# Patient Record
Sex: Female | Born: 2000 | Race: White | Hispanic: No | Marital: Single
Health system: Southern US, Community
[De-identification: ages and names within clinical notes are randomized; demographics above are authoritative.]

## PROBLEM LIST (undated history)

## (undated) DIAGNOSIS — S42009A Fracture of unspecified part of unspecified clavicle, initial encounter for closed fracture: Secondary | ICD-10-CM

## (undated) HISTORY — PX: APPENDECTOMY: SHX54

---
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
## 2019-04-02 ENCOUNTER — Emergency Department (HOSPITAL_COMMUNITY): Payer: No Typology Code available for payment source

## 2019-04-02 ENCOUNTER — Emergency Department (HOSPITAL_COMMUNITY)
Admission: EM | Admit: 2019-04-02 | Discharge: 2019-04-03 | Disposition: A | Discharge status: Home / Self Care | Payer: No Typology Code available for payment source | Attending: Emergency Medicine | Admitting: Emergency Medicine

## 2019-04-02 DIAGNOSIS — M545 Low back pain, unspecified: Secondary | ICD-10-CM

## 2019-04-02 DIAGNOSIS — Y999 Unspecified external cause status: Secondary | ICD-10-CM | POA: Insufficient documentation

## 2019-04-02 DIAGNOSIS — S60512A Abrasion of left hand, initial encounter: Secondary | ICD-10-CM | POA: Diagnosis not present

## 2019-04-02 DIAGNOSIS — S7002XA Contusion of left hip, initial encounter: Secondary | ICD-10-CM | POA: Diagnosis not present

## 2019-04-02 DIAGNOSIS — S161XXA Strain of muscle, fascia and tendon at neck level, initial encounter: Secondary | ICD-10-CM | POA: Diagnosis not present

## 2019-04-02 DIAGNOSIS — S50812A Abrasion of left forearm, initial encounter: Secondary | ICD-10-CM | POA: Diagnosis not present

## 2019-04-02 DIAGNOSIS — T07XXXA Unspecified multiple injuries, initial encounter: Secondary | ICD-10-CM

## 2019-04-02 DIAGNOSIS — R10814 Left lower quadrant abdominal tenderness: Secondary | ICD-10-CM | POA: Diagnosis not present

## 2019-04-02 DIAGNOSIS — Y929 Unspecified place or not applicable: Secondary | ICD-10-CM | POA: Insufficient documentation

## 2019-04-02 DIAGNOSIS — Z23 Encounter for immunization: Secondary | ICD-10-CM | POA: Insufficient documentation

## 2019-04-02 DIAGNOSIS — Y939 Activity, unspecified: Secondary | ICD-10-CM | POA: Insufficient documentation

## 2019-04-02 DIAGNOSIS — R51 Headache: Secondary | ICD-10-CM | POA: Insufficient documentation

## 2019-04-02 MED ORDER — SODIUM CHLORIDE 0.9 % IV BOLUS (SEPSIS)
1000.0000 mL | Freq: Once | INTRAVENOUS | Status: AC
  Administered 2019-04-03: 1000 mL via INTRAVENOUS

## 2019-04-02 MED ORDER — FENTANYL CITRATE (PF) 100 MCG/2ML IJ SOLN
50.0000 ug | Freq: Once | INTRAMUSCULAR | Status: AC
  Administered 2019-04-03: 50 ug via INTRAVENOUS
  Filled 2019-04-02: qty 2

## 2019-04-02 MED ORDER — TETANUS-DIPHTH-ACELL PERTUSSIS 5-2.5-18.5 LF-MCG/0.5 IM SUSP
0.5000 mL | Freq: Once | INTRAMUSCULAR | Status: AC
  Administered 2019-04-03: 0.5 mL via INTRAMUSCULAR
  Filled 2019-04-02: qty 0.5

## 2019-04-03 ENCOUNTER — Emergency Department (HOSPITAL_COMMUNITY): Payer: No Typology Code available for payment source

## 2019-04-03 ENCOUNTER — Encounter (HOSPITAL_COMMUNITY): Payer: Self-pay | Admitting: Emergency Medicine

## 2019-04-03 ENCOUNTER — Other Ambulatory Visit: Payer: Self-pay

## 2019-04-03 LAB — COMPREHENSIVE METABOLIC PANEL
ALT: 21 U/L (ref 0–44)
AST: 29 U/L (ref 15–41)
Albumin: 3.9 g/dL (ref 3.5–5.0)
Alkaline Phosphatase: 77 U/L (ref 38–126)
Anion gap: 8 (ref 5–15)
BUN: 14 mg/dL (ref 6–20)
CO2: 23 mmol/L (ref 22–32)
Calcium: 8.9 mg/dL (ref 8.9–10.3)
Chloride: 106 mmol/L (ref 98–111)
Creatinine, Ser: 0.85 mg/dL (ref 0.44–1.00)
GFR calc Af Amer: >60 mL/min (ref >60)
GFR calc non Af Amer: >60 mL/min (ref >60)
Glucose, Bld: 137 mg/dL — ABNORMAL HIGH (ref 70–99)
Potassium: 4.2 mmol/L (ref 3.5–5.1)
Sodium: 137 mmol/L (ref 135–145)
Total Bilirubin: 0.6 mg/dL (ref 0.3–1.2)
Total Protein: 7 g/dL (ref 6.5–8.1)

## 2019-04-03 LAB — CBC
HCT: 41.9 % (ref 36.0–46.0)
Hemoglobin: 13.4 g/dL (ref 12.0–15.0)
MCH: 28.6 pg (ref 26.0–34.0)
MCHC: 32 g/dL (ref 30.0–36.0)
MCV: 89.5 fL (ref 80.0–100.0)
Platelets: 243 10*3/uL (ref 150–400)
RBC: 4.68 MIL/uL (ref 3.87–5.11)
RDW: 11.9 % (ref 11.5–15.5)
WBC: 11.5 10*3/uL — ABNORMAL HIGH (ref 4.0–10.5)
nRBC: 0 % (ref 0.0–0.2)

## 2019-04-03 LAB — CDS SEROLOGY

## 2019-04-03 LAB — SAMPLE TO BLOOD BANK

## 2019-04-03 LAB — PROTIME-INR
INR: 1.1 (ref 0.8–1.2)
Prothrombin Time: 13.6 seconds (ref 11.4–15.2)

## 2019-04-03 LAB — I-STAT BETA HCG BLOOD, ED (MC, WL, AP ONLY): I-stat hCG, quantitative: <5 m[IU]/mL (ref <5)

## 2019-04-03 LAB — LACTIC ACID, PLASMA: Lactic Acid, Venous: 1.8 mmol/L (ref 0.5–1.9)

## 2019-04-03 LAB — ETHANOL: Alcohol, Ethyl (B): <10 mg/dL (ref <10)

## 2019-04-03 MED ORDER — HYDROCODONE-ACETAMINOPHEN 5-325 MG PO TABS: 1.0000 | ORAL_TABLET | Freq: Four times a day (QID) | ORAL | 0 refills | Status: DC | PRN

## 2019-04-03 MED ORDER — FENTANYL CITRATE (PF) 100 MCG/2ML IJ SOLN
50.0000 ug | Freq: Once | INTRAMUSCULAR | Status: AC
  Administered 2019-04-03: 50 ug via INTRAVENOUS
  Filled 2019-04-03: qty 2

## 2019-04-03 MED ORDER — SODIUM CHLORIDE 0.9 % IV BOLUS (SEPSIS)
1000.0000 mL | Freq: Once | INTRAVENOUS | Status: AC
  Administered 2019-04-03: 1000 mL via INTRAVENOUS

## 2019-04-03 MED ORDER — IOHEXOL 300 MG/ML  SOLN
100.0000 mL | Freq: Once | INTRAMUSCULAR | Status: AC | PRN
  Administered 2019-04-03: 100 mL via INTRAVENOUS

## 2019-04-03 MED ORDER — KETOROLAC TROMETHAMINE 30 MG/ML IJ SOLN
30.0000 mg | Freq: Once | INTRAMUSCULAR | Status: AC
  Administered 2019-04-03: 30 mg via INTRAVENOUS
  Filled 2019-04-03: qty 1

## 2019-04-03 NOTE — ED Notes (Signed)
Patient transported to CT 
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
## 2019-04-03 NOTE — ED Notes (Signed)
Reviewed d/c instructions with pt, who verbalized understanding and had no outstanding questions. Pt departed in NAD.   

## 2019-04-03 NOTE — ED Notes (Signed)
Pt ambulated from Resusc room to the bathroom in the blue pod. Pt was slow but denies any pain going to the bathroom. When pt came out of the bathroom pt is complaining of some dizziness and nausea. Pt is also complaining of trouble hearing. Pt is assisted to a wheel chair and taken back to Resusc room. Pt is still complaining of nausea pt is given a vomit bag and a wet wash cloth is placed on the back of her neck. Pt is assisted back to bed and hooked backup to the monitor. RN Marquita Palms is informed of pt complaining of nausea, dizziness and having trouble hearing

## 2019-04-05 ENCOUNTER — Encounter (HOSPITAL_COMMUNITY): Payer: Self-pay | Admitting: Emergency Medicine

## 2019-08-05 ENCOUNTER — Encounter: Payer: Self-pay | Admitting: Physical Therapy

## 2019-08-05 ENCOUNTER — Ambulatory Visit: Payer: No Typology Code available for payment source | Attending: Student | Admitting: Physical Therapy

## 2019-08-05 ENCOUNTER — Other Ambulatory Visit: Payer: Self-pay

## 2019-08-05 DIAGNOSIS — M25561 Pain in right knee: Secondary | ICD-10-CM | POA: Insufficient documentation

## 2019-08-05 DIAGNOSIS — M545 Low back pain, unspecified: Secondary | ICD-10-CM

## 2019-08-05 DIAGNOSIS — M25562 Pain in left knee: Secondary | ICD-10-CM | POA: Diagnosis present
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
## 2019-08-18 ENCOUNTER — Other Ambulatory Visit: Payer: Self-pay

## 2019-08-18 ENCOUNTER — Encounter: Payer: Self-pay | Admitting: Physical Therapy

## 2019-08-18 ENCOUNTER — Ambulatory Visit: Payer: No Typology Code available for payment source | Attending: Student | Admitting: Physical Therapy

## 2019-08-18 DIAGNOSIS — M25562 Pain in left knee: Secondary | ICD-10-CM | POA: Diagnosis present

## 2019-08-18 DIAGNOSIS — M545 Low back pain, unspecified: Secondary | ICD-10-CM

## 2019-08-18 DIAGNOSIS — M25561 Pain in right knee: Secondary | ICD-10-CM | POA: Diagnosis present

## 2019-08-18 NOTE — Therapy (Signed)
[facility] [address] [place], [place], [address] Phone: [phone]   Fax:  [phone]  Physical Therapy Treatment  Patient Details  Name: Vickie Flesherriana M Alig MRN: [id number] Date of Birth: 05/31/2001 Referring Provider (PT): Ulyses SouthwardSarah Yacobi, PA-C   Encounter Date: 08/18/2019  PT End of Session - 08/18/19 1510    Visit Number  2    Number of Visits  24    Date for PT Re-Evaluation  10/28/19    Authorization Type  Med Pay    PT Start Time  1505    PT Stop Time  1544    PT Time Calculation (min)  39 min    Activity Tolerance  Patient tolerated treatment well    Behavior During Therapy  [facility] for tasks assessed/performed       Past Medical History:  Diagnosis Date  . Collar bone fracture     Past Surgical History:  Procedure Laterality Date  . APPENDECTOMY    . LAPAROSCOPIC APPENDECTOMY N/A 11/18/2014   Procedure: APPENDECTOMY LAPAROSCOPIC;  Surgeon: Judie PetitM. Leonia CoronaShuaib Farooqui, MD;  Location: [facility] OR;  Service: Pediatrics;  Laterality: N/A;    There were no vitals filed for this visit.  Subjective Assessment - 08/18/19 1507    Subjective  Pt. returns for first follow up since initial eval visit. She reports trying to walk more "straight" which has helped some with pain. Variable/limited HEP performance due to schedule.    Currently in Pain?  No/denies                       [facility] Treatment/Exercise - 08/18/19 0001      Knee/Hip Exercises: Stretches   Passive Hamstring Stretch  Right;Left;2 reps;30 seconds    Other Knee/Hip Stretches  sidelying manual left QL stretch 3x30 sec      Knee/Hip Exercises: Aerobic   Recumbent Bike  L1 x 5 min      Knee/Hip Exercises: Standing   Wall Squat Limitations  wall sit partial squat to 40 deg x 15 reps    Other Standing Knee Exercises  Monster walk with greeb band proximal to knees 20 feet x 3, standing hip 3-way with green band 1x10 ea. bilat.      Knee/Hip Exercises:  Supine   Bridges Limitations  hip bridge with legs on 55 cm ball x 15 reps    Other Supine Knee/Hip Exercises  clamshell with green band x 15 reps      Manual Therapy   Manual Therapy  Soft tissue mobilization    Soft tissue mobilization  STM left lumbar and posterolateral hip region in right sidelying             PT Education - 08/18/19 1548    Education Details  exercises, HEP, POC    Person(s) Educated  Patient    Methods  Explanation;Demonstration;Verbal cues    Comprehension  Verbalized understanding;Returned demonstration       PT Short Term Goals - 08/05/19 2137      PT SHORT TERM GOAL #1   Title  Independent with HEP    Baseline  needs HEP    Time  6    Period  Weeks    Status  New      PT SHORT TERM GOAL #2   Title  Return demos proper squat mechanics to decrease knee pain with squatting and lifting motions and improve ability for LE strength progression with closed chain activities  Baseline  quad dominant squat with decreased femoral control    Time  6    Period  Weeks    Status  New      PT SHORT TERM GOAL #3   Title  Tolerate standing/ambulation at least 30 minutes without limitation due to back and knee pain    Baseline  limited 10-20 minutes    Time  6    Period  Weeks    Status  New        PT Long Term Goals - 08/05/19 2139      PT LONG TERM GOAL #1   Title  Improve FOTO outcome measure score to 27% or less impairment    Baseline  49% limited    Time  12    Period  Weeks    Status  New    Target Date  10/28/19      PT LONG TERM GOAL #2   Title  Left hip and knee strength grossly 5/5 to improve ability for lifting activities, improved squat mechanics for decreased knee pain    Baseline  see objective    Time  12    Period  Weeks    Status  New    Target Date  10/28/19      PT LONG TERM GOAL #3   Title  Tolerate standing and ambulation periods 45-60 minutes for work duties, chores and community mobility without limitation due to  back and knee pain    Baseline  limited 10-20 minutes    Time  12    Period  Weeks    Status  New    Target Date  10/28/19            Plan - 08/18/19 1548    Clinical Impression Statement  At first follow up visit tx. was well-tolerated with performance/progression of strengthening exercises as well as manual therapy to left lumbar region. Limited closed chain activities/performed in partial ROM to avoid pain/due to patellofemoral mechanics noted at eval but will continue to add/progress as tolerated pending pain.    Personal Factors and Comorbidities  Time since onset of injury/illness/exacerbation;Other    Examination-Activity Limitations  Lift;Squat;Locomotion Level;Stand    Examination-Participation Restrictions  Community Activity;Meal Prep;Other    Stability/Clinical Decision Making  Stable/Uncomplicated    Clinical Decision Making  Low    Rehab Potential  Good    PT Frequency  --   1-2x/week   PT Duration  12 weeks    PT Treatment/Interventions  ADLs/Self Care Home Management;Cryotherapy;Ultrasound;Electrical Stimulation;Iontophoresis 4mg /ml Dexamethasone;Moist Heat;Gait training;Stair training;Balance training;Therapeutic exercise;Therapeutic activities;Functional mobility training;Neuromuscular re-education;Patient/family education;Manual techniques;Passive range of motion;Dry needling;Vasopneumatic Device;Spinal Manipulations;Taping    PT Next Visit Plan  continue knee and hip strengthening, lumbar/core stabilization, initial open chain focus until closed chain mechanics improve, stretch hamstrings, STM left lumbar/posterolateral hip region, modalities prn    PT Home Exercise Plan  quad sets, supine SLR, hip abd SLR, clamshell, hip bridge, hamstring stretch, left glut/posterolateral hip stretch    Consulted and Agree with Plan of Care  Patient       Patient will benefit from skilled therapeutic intervention in order to improve the following deficits and impairments:   Decreased strength, Pain, Impaired flexibility, Decreased activity tolerance, Decreased range of motion, Increased edema, Increased muscle spasms, Difficulty walking  Visit Diagnosis: Acute pain of left knee  Acute pain of right knee  Acute left-sided low back pain without sciatica     Problem List Patient Active Problem List  Diagnosis Date Noted  . Appendicitis, acute 11/18/2014  . Acute appendicitis 11/17/2014    Beaulah Dinning, PT, DPT 08/18/19 3:52 PM  [facility] [address] [place], [place], [address] Phone: [phone]   Fax:  [phone]  Name: Vickie Landry MRN: [id number] Date of Birth: 02/15/2001

## 2019-08-25 ENCOUNTER — Ambulatory Visit: Payer: No Typology Code available for payment source | Admitting: Physical Therapy

## 2019-08-25 ENCOUNTER — Other Ambulatory Visit: Payer: Self-pay

## 2019-08-25 ENCOUNTER — Encounter: Payer: Self-pay | Admitting: Physical Therapy

## 2019-08-25 DIAGNOSIS — M545 Low back pain, unspecified: Secondary | ICD-10-CM

## 2019-08-25 DIAGNOSIS — M25562 Pain in left knee: Secondary | ICD-10-CM

## 2019-08-25 DIAGNOSIS — M25561 Pain in right knee: Secondary | ICD-10-CM

## 2019-08-25 NOTE — Therapy (Signed)
[facility] Out[facility] Rehabilitation [facility] [address] [place], [place], [address] Phone: [phone]   Fax:  [phone]  Physical Therapy Treatment  Patient Details  Name: Vickie Landry MRN: [id number] Date of Birth: 28-Aug-2001 Referring Provider (PT): Ulyses Southward, PA-C   Encounter Date: 08/25/2019  PT End of Session - 08/25/19 1718    Visit Number  3    Number of Visits  24    Date for PT Re-Evaluation  10/28/19    Authorization Type  Med [employer]    PT Start Time  1632    PT Stop Time  1710    PT Time Calculation (min)  38 min    Activity Tolerance  Patient tolerated treatment well    Behavior During Therapy  [facility] for tasks assessed/performed       Past Medical History:  Diagnosis Date  . Collar bone fracture     Past Surgical History:  Procedure Laterality Date  . APPENDECTOMY    . LAPAROSCOPIC APPENDECTOMY N/A 11/18/2014   Procedure: APPENDECTOMY LAPAROSCOPIC;  Surgeon: Judie Petit. Leonia Corona, MD;  Location: [facility] OR;  Service: Pediatrics;  Laterality: N/A;    There were no vitals filed for this visit.  Subjective Assessment - 08/25/19 1633    Subjective  No pain at rest pre-tx. but noting intermittent pain in knees up to 5/10 with movement, walking at times. Back doing better this week. No significant soreness noted after last session. Limited HEP performance-discussed try to do exercises at least several days per week for improved progress with knees.    Currently in Pain?  No/denies                       [facility] Adult PT Treatment/Exercise - 08/25/19 0001      Exercises   Exercises  Lumbar      Lumbar Exercises: Stretches   Single Knee to Chest Stretch  Right;Left;5 reps   5-10 sec holds   Lower Trunk Rotation Limitations  x 10 reps ea. way bilat.    Passive Hamstring Stretch  Right;Left;2 reps;30 seconds    ITB Stretch  Right;Left;2 reps;30 seconds    ITB Stretch Limitations  performed on right with knee flexed due to  disocomfort reported when knee was extended      Lumbar Exercises: Supine   Bent Knee Raise  15 reps    Bridge with Ball Squeeze  20 reps    Straight Leg Raise  20 reps    Straight Leg Raises Limitations  bilat.      Knee/Hip Exercises: Stretches   Other Knee/Hip Stretches  sidelying manual left QL stretch 3x30 sec      Knee/Hip Exercises: Aerobic   Recumbent Bike  L2 x 5 minutes      Knee/Hip Exercises: Standing   Functional Squat Limitations  TRX partial squat x 10 reps (1 set only due to soreness)    Other Standing Knee Exercises  Monster walk with green band above knees 20 feet x 2, sidestepping with green band around feet 10 feet x 2      Knee/Hip Exercises: Seated   Other Seated Knee/Hip Exercises  seated hip ER with green band 2x10 ea. bilat.      Manual Therapy   Soft tissue mobilization  STM left lumbar and posterolateral hip region in right sidelying             PT Education - 08/25/19 1717    Education Details  exercises,  HEP    Person(s) Educated  Patient    Methods  Explanation;Demonstration    Comprehension  Verbalized understanding;Returned demonstration       PT Short Term Goals - 08/05/19 2137      PT SHORT TERM GOAL #1   Title  Independent with HEP    Baseline  needs HEP    Time  6    Period  Weeks    Status  New      PT SHORT TERM GOAL #2   Title  Return demos proper squat mechanics to decrease knee pain with squatting and lifting motions and improve ability for LE strength progression with closed chain activities    Baseline  quad dominant squat with decreased femoral control    Time  6    Period  Weeks    Status  New      PT SHORT TERM GOAL #3   Title  Tolerate standing/ambulation at least 30 minutes without limitation due to back and knee pain    Baseline  limited 10-20 minutes    Time  6    Period  Weeks    Status  New        PT Long Term Goals - 08/05/19 2139      PT LONG TERM GOAL #1   Title  Improve FOTO outcome measure  score to 27% or less impairment    Baseline  49% limited    Time  12    Period  Weeks    Status  New    Target Date  10/28/19      PT LONG TERM GOAL #2   Title  Left hip and knee strength grossly 5/5 to improve ability for lifting activities, improved squat mechanics for decreased knee pain    Baseline  see objective    Time  12    Period  Weeks    Status  New    Target Date  10/28/19      PT LONG TERM GOAL #3   Title  Tolerate standing and ambulation periods 45-60 minutes for work duties, chores and community mobility without limitation due to back and knee pain    Baseline  limited 10-20 minutes    Time  12    Period  Weeks    Status  New    Target Date  10/28/19            Plan - 08/25/19 1719    Clinical Impression Statement  Good progress for back and fair status for knees still with limited tolerance closed chain knee exercises with patellofemoral pain and altered femoral mechanics with closed chain activities    Personal Factors and Comorbidities  Time since onset of injury/illness/exacerbation;Other    Examination-Activity Limitations  Lift;Squat;Locomotion Level;Stand    Examination-Participation Restrictions  Community Activity;Meal Prep;Other    Stability/Clinical Decision Making  Stable/Uncomplicated    Clinical Decision Making  Low    Rehab Potential  Good    PT Frequency  --   1-2x/week   PT Duration  12 weeks    PT Treatment/Interventions  ADLs/Self Care Home Management;Cryotherapy;Ultrasound;Electrical Stimulation;Iontophoresis 4mg /ml Dexamethasone;Moist Heat;Gait training;Stair training;Balance training;Therapeutic exercise;Therapeutic activities;Functional mobility training;Neuromuscular re-education;Patient/family education;Manual techniques;Passive range of motion;Dry needling;Vasopneumatic Device;Spinal Manipulations;Taping    PT Next Visit Plan  continue knee and hip strengthening, lumbar/core stabilization, initial open chain focus until closed chain  mechanics improve, stretch hamstrings, STM left lumbar/posterolateral hip region, modalities prn    PT Home Exercise Plan  quad sets,  supine SLR, hip abd SLR, clamshell, hip bridge, hamstring stretch, left glut/posterolateral hip stretch    Consulted and Agree with Plan of Care  Patient       Patient will benefit from skilled therapeutic intervention in order to improve the following deficits and impairments:  Decreased strength, Pain, Impaired flexibility, Decreased activity tolerance, Decreased range of motion, Increased edema, Increased muscle spasms, Difficulty walking  Visit Diagnosis: Acute pain of left knee  Acute pain of right knee  Acute left-sided low back pain without sciatica     Problem List Patient Active Problem List   Diagnosis Date Noted  . Appendicitis, acute 11/18/2014  . Acute appendicitis 11/17/2014    Lazarus Gowdahristopher Zoch, PT, DPT 08/25/19 5:23 PM  [facility] [address] [place], [place], [address] Phone: [phone]   Fax:  [phone]  Name: Marylou Flesherriana M Ogarro MRN: [id number] Date of Birth: 09/18/2001

## 2019-09-01 ENCOUNTER — Ambulatory Visit: Payer: No Typology Code available for payment source | Admitting: Physical Therapy

## 2019-09-01 ENCOUNTER — Encounter: Payer: Self-pay | Admitting: Physical Therapy

## 2019-09-01 ENCOUNTER — Other Ambulatory Visit: Payer: Self-pay

## 2019-09-01 DIAGNOSIS — M25561 Pain in right knee: Secondary | ICD-10-CM

## 2019-09-01 DIAGNOSIS — M545 Low back pain, unspecified: Secondary | ICD-10-CM

## 2019-09-01 DIAGNOSIS — M25562 Pain in left knee: Secondary | ICD-10-CM

## 2019-09-01 NOTE — Therapy (Signed)
[place], [place], [address] Phone: [phone]   Fax:  [phone]  Physical Therapy Treatment  Patient Details  Name: Vickie Landry MRN: [id number] Date of Birth: 2001-05-30 Referring Provider (PT): Patrecia Pace, PA-C   Encounter Date: 09/01/2019  PT End of Session - 09/01/19 1626    Visit Number  4    Number of Visits  24    Date for PT Re-Evaluation  10/28/19    Authorization Type  Med Pay    PT Start Time  1344    PT Stop Time  1425    PT Time Calculation (min)  41 min    Activity Tolerance  Patient tolerated treatment well    Behavior During Therapy  [facility] for tasks assessed/performed       Past Medical History:  Diagnosis Date  . Landry bone fracture     Past Surgical History:  Procedure Laterality Date  . APPENDECTOMY    . LAPAROSCOPIC APPENDECTOMY N/A 11/18/2014   Procedure: APPENDECTOMY LAPAROSCOPIC;  Surgeon: Jerilynn Mages. Gerald Stabs, MD;  Location: [place];  Service: Pediatrics;  Laterality: N/A;    There were no vitals filed for this visit.  Subjective Assessment - 09/01/19 1543    Subjective  Left knee has been hurting recently. No right knee pain and back has been doing OK with some tightness noted on left side but minimal pain.    Currently in Pain?  Yes    Pain Score  4     Pain Score  4    Pain Location  Knee    Pain Orientation  Left    Pain Descriptors / Indicators  Sharp    Pain Onset  More than a month ago    Pain Frequency  Intermittent    Aggravating Factors   standing and walking    Pain Relieving Factors  rest    Effect of Pain on Daily Activities  limits mobility + positional tolerance for prolonged standing and ambulation         [facility] PT Assessment - 09/01/19 0001      Strength   Left Knee Flexion  5/5    Left Knee Extension  4+/5                   [facility] Adult PT Treatment/Exercise - 09/01/19 0001      Knee/Hip Exercises: Stretches   Passive  Hamstring Stretch  Right;Left;3 reps;30 seconds    ITB Stretch  Right;Left;3 reps;30 seconds      Knee/Hip Exercises: Aerobic   Recumbent Bike  L1-2 x 5 minutes      Knee/Hip Exercises: Standing   Other Standing Knee Exercises  hip abd isometric at wall 10 x 5 sec ea. bilat., hip hinge/SL RDL x 10 ea. bilat.    Other Standing Knee Exercises  sidestepping with Green Theraband at feet 15 feet x 2, partial squat at wall x 15 reps, hip hilke from 4" step x 15 reps ea. bilat.      Knee/Hip Exercises: Supine   Bridges with Clamshell  Strengthening;Both;2 sets;10 reps   green Theraband   Straight Leg Raises  AROM;Strengthening;Both;2 sets;10 reps    Straight Leg Raises Limitations  1 lb.    Other Supine Knee/Hip Exercises  clamshell x 20 ea. bilat. with alternating unilat. ROM      Manual Therapy   Manual therapy comments  patellar mobilization left knee-medial glides and tilt grade I-III,  Soft tissue mobilization  STM left lumbar paraspinals in right sidelying             PT Education - 09/01/19 1616    Education Details  exercises    Person(s) Educated  Patient    Methods  Explanation;Demonstration;Verbal cues    Comprehension  Returned demonstration;Verbalized understanding       PT Short Term Goals - 08/05/19 2137      PT SHORT TERM GOAL #1   Title  Independent with HEP    Baseline  needs HEP    Time  6    Period  Weeks    Status  New      PT SHORT TERM GOAL #2   Title  Return demos proper squat mechanics to decrease knee pain with squatting and lifting motions and improve ability for LE strength progression with closed chain activities    Baseline  quad dominant squat with decreased femoral control    Time  6    Period  Weeks    Status  New      PT SHORT TERM GOAL #3   Title  Tolerate standing/ambulation at least 30 minutes without limitation due to back and knee pain    Baseline  limited 10-20 minutes    Time  6    Period  Weeks    Status  New         PT Long Term Goals - 08/05/19 2139      PT LONG TERM GOAL #1   Title  Improve FOTO outcome measure score to 27% or less impairment    Baseline  49% limited    Time  12    Period  Weeks    Status  New    Target Date  10/28/19      PT LONG TERM GOAL #2   Title  Left hip and knee strength grossly 5/5 to improve ability for lifting activities, improved squat mechanics for decreased knee pain    Baseline  see objective    Time  12    Period  Weeks    Status  New    Target Date  10/28/19      PT LONG TERM GOAL #3   Title  Tolerate standing and ambulation periods 45-60 minutes for work duties, chores and community mobility without limitation due to back and knee pain    Baseline  limited 10-20 minutes    Time  12    Period  Weeks    Status  New    Target Date  10/28/19            Plan - 09/01/19 1626    Clinical Impression Statement  Still with some limitation of ability with closed chain knee exercises/compromised patellofemoral mechanics but improving from baseline status particularly with back-for knees expect more gradual progress to achieve strength gains.    Personal Factors and Comorbidities  Time since onset of injury/illness/exacerbation;Other    Examination-Activity Limitations  Lift;Squat;Locomotion Level;Stand    Examination-Participation Restrictions  Community Activity;Meal Prep;Other    Stability/Clinical Decision Making  Stable/Uncomplicated    Clinical Decision Making  Low    Rehab Potential  Good    PT Frequency  --   1-2x/week   PT Duration  12 weeks    PT Treatment/Interventions  ADLs/Self Care Home Management;Cryotherapy;Ultrasound;Electrical Stimulation;Iontophoresis 4mg /ml Dexamethasone;Moist Heat;Gait training;Stair training;Balance training;Therapeutic exercise;Therapeutic activities;Functional mobility training;Neuromuscular re-education;Patient/family education;Manual techniques;Passive range of motion;Dry needling;Vasopneumatic Device;Spinal  Manipulations;Taping    PT Next Visit  Plan  continue knee and hip strengthening, lumbar/core stabilization, initial open chain focus until closed chain mechanics improve, stretch hamstrings, STM left lumbar/posterolateral hip region, modalities prn    PT Home Exercise Plan  quad sets, supine SLR, hip abd SLR, clamshell, hip bridge, hamstring stretch, left glut/posterolateral hip stretch    Consulted and Agree with Plan of Care  Patient       Patient will benefit from skilled therapeutic intervention in order to improve the following deficits and impairments:  Decreased strength, Pain, Impaired flexibility, Decreased activity tolerance, Decreased range of motion, Increased edema, Increased muscle spasms, Difficulty walking  Visit Diagnosis: Acute pain of left knee  Acute pain of right knee  Acute left-sided low back pain without sciatica     Problem List Patient Active Problem List   Diagnosis Date Noted  . Appendicitis, acute 11/18/2014  . Acute appendicitis 11/17/2014    Lazarus Gowda, PT, DPT 09/01/19 4:29 PM  [facility] [address] [place], [place], [address] Phone: [phone]   Fax:  [phone]  Name: Vickie Landry MRN: [id number] Date of Birth: 01-03-2001

## 2019-09-06 ENCOUNTER — Other Ambulatory Visit: Payer: Self-pay

## 2019-09-06 ENCOUNTER — Ambulatory Visit: Payer: No Typology Code available for payment source | Admitting: Physical Therapy

## 2019-09-06 ENCOUNTER — Encounter: Payer: Self-pay | Admitting: Physical Therapy

## 2019-09-06 DIAGNOSIS — M545 Low back pain, unspecified: Secondary | ICD-10-CM

## 2019-09-06 DIAGNOSIS — M25562 Pain in left knee: Secondary | ICD-10-CM

## 2019-09-06 DIAGNOSIS — M25561 Pain in right knee: Secondary | ICD-10-CM

## 2019-09-06 NOTE — Therapy (Signed)
[place], [place], [address] Phone: [phone]   Fax:  [phone]  Physical Therapy Treatment  Patient Details  Name: Vickie Landry MRN: [id number] Date of Birth: 07/29/2001 Referring Provider (PT): Patrecia Pace, PA-C   Encounter Date: 09/06/2019  PT End of Session - 09/06/19 1633    Visit Number  5    Number of Visits  24    Date for PT Re-Evaluation  10/28/19    Authorization Type  Med Pay    PT Start Time  7342    PT Stop Time  1626    PT Time Calculation (min)  40 min    Activity Tolerance  Patient tolerated treatment well    Behavior During Therapy  [facility] for tasks assessed/performed       Past Medical History:  Diagnosis Date  . Collar bone fracture     Past Surgical History:  Procedure Laterality Date  . APPENDECTOMY    . LAPAROSCOPIC APPENDECTOMY N/A 11/18/2014   Procedure: APPENDECTOMY LAPAROSCOPIC;  Surgeon: Jerilynn Mages. Gerald Stabs, MD;  Location: [place];  Service: Pediatrics;  Laterality: N/A;    There were no vitals filed for this visit.  Subjective Assessment - 09/06/19 1549    Subjective  Pt. rates improvement for her back at 85-95% from baseline. For her knees she reports 75-80% improvement with some residual pain still in left knee.    How long can you walk comfortably?  1 mile, around 30 minutes    Diagnostic tests  CT scan, X-rays    Patient Stated Goals  "have knee working right"    Currently in Pain?  No/denies         [facility] PT Assessment - 09/06/19 0001      Observation/Other Assessments   Focus on Therapeutic Outcomes (FOTO)   37% limited      Strength   Left Hip Extension  5/5    Left Hip ABduction  5/5    Left Hip ADduction  5/5                   [facility] Adult PT Treatment/Exercise - 09/06/19 0001      Lumbar Exercises: Stretches   Passive Hamstring Stretch  Right;Left;2 reps;30 seconds    ITB Stretch  Right;Left;2 reps;30 seconds      Lumbar  Exercises: Supine   Dead Bug  15 reps      Knee/Hip Exercises: Aerobic   Recumbent Bike  L2 x 5 min      Knee/Hip Exercises: Standing   Other Standing Knee Exercises  hip abd isometric in standing 10 reps ea. bilat.      Knee/Hip Exercises: Supine   Bridges  AROM;Strengthening;Both;20 reps    Other Supine Knee/Hip Exercises  clamshell x 20 reps with blue band      Manual Therapy   Manual Therapy  Joint mobilization    Joint Mobilization  LAD bilat. hips grade I-IV    Soft tissue mobilization  STM left lumbar paraspinals in right sidelying             PT Education - 09/06/19 1632    Education Details  exercises, POC, HEP updates    Person(s) Educated  Patient    Methods  Explanation;Demonstration;Verbal cues;Handout    Comprehension  Returned demonstration;Verbalized understanding       PT Short Term Goals - 09/06/19 1633      PT SHORT TERM GOAL #1   Title  Independent  with HEP    Baseline  met    Time  6    Period  Weeks    Status  Achieved      PT SHORT TERM GOAL #2   Title  Return demos proper squat mechanics to decrease knee pain with squatting and lifting motions and improve ability for LE strength progression with closed chain activities    Baseline  still some decreased femoral control but tolerates wall sit partial squat    Time  6    Period  Weeks    Status  On-going      PT SHORT TERM GOAL #3   Title  Tolerate standing/ambulation at least 30 minutes without limitation due to back and knee pain    Baseline  improved but intermittently limited with left knee pain > LBP    Time  6    Period  Weeks    Status  On-going        PT Long Term Goals - 09/06/19 1635      PT LONG TERM GOAL #1   Title  Improve FOTO outcome measure score to 27% or less impairment    Baseline  37% limited    Time  12    Period  Weeks    Status  On-going      PT LONG TERM GOAL #2   Title  Left hip and knee strength grossly 5/5 to improve ability for lifting activities,  improved squat mechanics for decreased knee pain    Baseline  met except left knee ext 4+/5    Time  12    Period  Weeks    Status  Partially Met      PT LONG TERM GOAL #3   Title  Tolerate standing and ambulation periods 45-60 minutes for work duties, chores and community mobility without limitation due to back and knee pain    Baseline  variable-limited around 30 minutes/1 mile    Time  12    Period  Weeks    Status  On-going            Plan - 09/06/19 1633    Clinical Impression Statement  Pt. has attended 5 therapy visits with good progress from baseline for decreased back and knee symptoms. Still some residual pain in left knee as well as some tightness noted in lumbar region but LBP minimal. At this point and with discussing options with pt. plan will be for her to try continuing progress independently with updated HEP-if improvement continues then plan tentative d/c but plan of care to allow for return within original plan of care dates if further skilled PT needed.    Personal Factors and Comorbidities  Time since onset of injury/illness/exacerbation;Other    Examination-Activity Limitations  Lift;Squat;Locomotion Level;Stand    Stability/Clinical Decision Making  Stable/Uncomplicated    Rehab Potential  Good    PT Frequency  --   1-2x/week   PT Duration  12 weeks    PT Treatment/Interventions  ADLs/Self Care Home Management;Cryotherapy;Ultrasound;Electrical Stimulation;Iontophoresis 56m/ml Dexamethasone;Moist Heat;Gait training;Stair training;Balance training;Therapeutic exercise;Therapeutic activities;Functional mobility training;Neuromuscular re-education;Patient/family education;Manual techniques;Passive range of motion;Dry needling;Vasopneumatic Device;Spinal Manipulations;Taping    PT Next Visit Plan  continue knee and hip strengthening, lumbar/core stabilization, initial open chain focus until closed chain mechanics improve, stretch hamstrings, STM left  lumbar/posterolateral hip region, modalities prn    PT Home Exercise Plan  hip abd isometric, wall sit partial squat, hip bridge, monster walk, clamshell, hamstring stretches, supine SLR, dead bug  Consulted and Agree with Plan of Care  Patient       Patient will benefit from skilled therapeutic intervention in order to improve the following deficits and impairments:  Decreased strength, Pain, Impaired flexibility, Decreased activity tolerance, Decreased range of motion, Increased edema, Increased muscle spasms, Difficulty walking  Visit Diagnosis: Acute pain of left knee  Acute pain of right knee  Acute left-sided low back pain without sciatica     Problem List Patient Active Problem List   Diagnosis Date Noted  . Appendicitis, acute 11/18/2014  . Acute appendicitis 11/17/2014    Beaulah Dinning, PT, DPT 09/06/19 4:40 PM  [facility] [address] [place], [place], [address] Phone: [phone]   Fax:  [phone]  Name: Vickie Landry MRN: [id number] Date of Birth: 10/02/2001

## 2019-10-06 NOTE — Therapy (Signed)
[place], [place], [address] Phone: [phone]   Fax:  [phone]  Physical Therapy Treatment/Discharge  Patient Details  Name: Vickie Landry MRN: [id number] Date of Birth: [birth year] Referring Provider (PT): Patrecia Pace, PA-C   Encounter Date: 09/06/2019    Past Medical History:  Diagnosis Date  . Collar bone fracture     Past Surgical History:  Procedure Laterality Date  . APPENDECTOMY    . LAPAROSCOPIC APPENDECTOMY N/A 11/18/2014   Procedure: APPENDECTOMY LAPAROSCOPIC;  Surgeon: Jerilynn Mages. Gerald Stabs, MD;  Location: [facility];  Service: Pediatrics;  Laterality: N/A;    There were no vitals filed for this visit.                              PT Short Term Goals - 09/06/19 1633      PT SHORT TERM GOAL #1   Title  Independent with HEP    Baseline  met    Time  6    Period  Weeks    Status  Achieved      PT SHORT TERM GOAL #2   Title  Return demos proper squat mechanics to decrease knee pain with squatting and lifting motions and improve ability for LE strength progression with closed chain activities    Baseline  still some decreased femoral control but tolerates wall sit partial squat    Time  6    Period  Weeks    Status  On-going      PT SHORT TERM GOAL #3   Title  Tolerate standing/ambulation at least 30 minutes without limitation due to back and knee pain    Baseline  improved but intermittently limited with left knee pain > LBP    Time  6    Period  Weeks    Status  On-going        PT Long Term Goals - 09/06/19 1635      PT LONG TERM GOAL #1   Title  Improve FOTO outcome measure score to 27% or less impairment    Baseline  37% limited    Time  12    Period  Weeks    Status  On-going      PT LONG TERM GOAL #2   Title  Left hip and knee strength grossly 5/5 to improve ability for lifting activities, improved squat mechanics for decreased knee pain    Baseline  met except left knee ext 4+/5    Time  12    Period  Weeks    Status  Partially Met      PT LONG TERM GOAL #3   Title  Tolerate standing and ambulation periods 45-60 minutes for work duties, chores and community mobility without limitation due to back and knee pain    Baseline  variable-limited around 30 minutes/1 mile    Time  12    Period  Weeks    Status  On-going              Patient will benefit from skilled therapeutic intervention in order to improve the following deficits and impairments:  Decreased strength, Pain, Impaired flexibility, Decreased activity tolerance, Decreased range of motion, Increased edema, Increased muscle spasms, Difficulty walking  Visit Diagnosis: Acute pain of left knee  Acute pain of right knee  Acute left-sided low back pain without sciatica     Problem List Patient Active Problem List  Diagnosis Date Noted  . Appendicitis, acute 11/18/2014  . Acute appendicitis 11/17/2014      PHYSICAL THERAPY DISCHARGE SUMMARY  Visits from Start of Care: 5  Current functional level related to goals / functional outcomes: Patient last seen for therapy 09/06/19-plan of care was left open in case of need for return-patient had been improving and plan was to try HEP and return only if needed-patient did not return for further visits so plan d/c and recommend follow up with referring provide if having any changes in status.   Remaining deficits: NA   Education / Equipment: NA  Plan: Patient agrees to discharge.  Patient goals were partially met. Patient is being discharged due to meeting the stated rehab goals.  ?????          Beaulah Dinning, PT, DPT 10/06/19 4:47 PM     [place] [facility] [address] [place], [place], [address] Phone: [phone]   Fax:  [phone]  Name: Vickie Landry MRN: [id number] Date of Birth: [birth year]

## 2019-10-11 ENCOUNTER — Other Ambulatory Visit: Payer: Self-pay

## 2019-10-11 ENCOUNTER — Ambulatory Visit (HOSPITAL_COMMUNITY)
Admission: EM | Admit: 2019-10-11 | Discharge: 2019-10-11 | Disposition: A | Discharge status: Home / Self Care | Payer: Self-pay | Attending: Family Medicine | Admitting: Family Medicine

## 2019-10-11 ENCOUNTER — Encounter (HOSPITAL_COMMUNITY): Payer: Self-pay

## 2019-10-11 DIAGNOSIS — X501XXA Overexertion from prolonged static or awkward postures, initial encounter: Secondary | ICD-10-CM

## 2019-10-11 DIAGNOSIS — S83421A Sprain of lateral collateral ligament of right knee, initial encounter: Secondary | ICD-10-CM

## 2019-10-11 NOTE — Discharge Instructions (Signed)
This is most likely a knee sprain.  We will give you a knee brace to wear here in clinic. Rest, ice, elevate. Ibuprofen 600 mg every 6 hours Use the crutch as needed Follow up with orthopedics as needed.

## 2019-10-11 NOTE — ED Triage Notes (Signed)
Patient presents to Urgent Care with complaints of right knee pain since possibly injuring it while skating two days ago. Patient reports she "wants to see if the muscles are okay". Pt has been taking ibuprofen for pain, ambulatory with steady gait into triage.

## 2019-10-13 NOTE — ED Provider Notes (Signed)
[place]    CSN: [id number] Arrival date & time: 10/11/19  1325      History   Chief Complaint Chief Complaint  Patient presents with  . Knee Pain    HPI Vickie Landry is a 18 y.o. female.   Pt is an 18 year old female that presents with right knee pain x 2 days. Started after skating and doing the limbo, twisting knee outward. She is having generalized knee pain. This has been constant, waxing and waning. She has been taking ibuprofen for the pain with some relief. She is able to ambulate.  No bruising, swelling, deformity.   ROS per HPI    Knee Pain   Past Medical History:  Diagnosis Date  . Collar bone fracture     Patient Active Problem List   Diagnosis Date Noted  . Appendicitis, acute 11/18/2014  . Acute appendicitis 11/17/2014    Past Surgical History:  Procedure Laterality Date  . APPENDECTOMY    . LAPAROSCOPIC APPENDECTOMY N/A 11/18/2014   Procedure: APPENDECTOMY LAPAROSCOPIC;  Surgeon: Jerilynn Mages. Gerald Stabs, MD;  Location: [facility];  Service: Pediatrics;  Laterality: N/A;    OB History   No obstetric history on file.      Home Medications    Prior to Admission medications   Medication Sig Start Date End Date Taking? Authorizing Provider  acetaminophen (TYLENOL) 500 MG tablet Take 1,000 mg by mouth every 6 (six) hours as needed.    [provider]  fexofenadine (ALLEGRA) 60 MG tablet Take 1 tablet (60 mg total) by mouth daily. Patient not taking: Reported on 08/05/2019 07/28/17 10/11/19  Maczis, Barth Kirks, PA-C  fluticasone [facility]) 50 MCG/ACT nasal spray Place 2 sprays into both nostrils daily. Patient not taking: Reported on 08/05/2019 07/28/17 10/11/19  Maczis, Barth Kirks, PA-C    Family History Family History  Problem Relation Age of Onset  . Healthy Mother   . Healthy Father     Social History Social History   Tobacco Use  . Smoking status: Never Smoker  . Smokeless tobacco: Never Used  Substance Use Topics  .  Alcohol use: Yes  . Drug use: Not Currently     Allergies   Other   Review of Systems Review of Systems   Physical Exam Triage Vital Signs ED Triage Vitals  Enc Vitals Group     BP 10/11/19 1423 110/76     Pulse Rate 10/11/19 1423 80     Resp 10/11/19 1423 15     Temp 10/11/19 1423 98 F (36.7 C)     Temp Source 10/11/19 1423 Temporal     SpO2 10/11/19 1423 100 %     Weight --      Height --      Head Circumference --      Peak Flow --      Pain Score 10/11/19 1421 10     Pain Loc --      Pain Edu? --      Excl. in [place]? --    No data found.  Updated Vital Signs BP 110/76 (BP Location: Left Arm)   Pulse 80   Temp 98 F (36.7 C) (Temporal)   Resp 15   SpO2 100%   Visual Acuity Right Eye Distance:   Left Eye Distance:   Bilateral Distance:    Right Eye Near:   Left Eye Near:    Bilateral Near:     Physical Exam Vitals signs and nursing note  reviewed.  Constitutional:      General: She is not in acute distress.    Appearance: Normal appearance. She is not ill-appearing, toxic-appearing or diaphoretic.  HENT:     Head: Normocephalic.     Nose: Nose normal.     Mouth/Throat:     Pharynx: Oropharynx is clear.  Eyes:     Conjunctiva/sclera: Conjunctivae normal.  Neck:     Musculoskeletal: Normal range of motion.  Pulmonary:     Effort: Pulmonary effort is normal.  Musculoskeletal: Normal range of motion.        General: No swelling.     Comments: Mild generalized tenderness more to the LCL No laxity.   Skin:    General: Skin is warm and dry.     Findings: No rash.  Neurological:     Mental Status: She is alert.  Psychiatric:        Mood and Affect: Mood normal.      UC Treatments / Results  Labs (all labs ordered are listed, but only abnormal results are displayed) Labs Reviewed - No data to display  EKG   Radiology No results found.  Procedures Procedures (including critical care time)  Medications Ordered in UC Medications -  No data to display  Initial Impression / Assessment and Plan / UC Course  I have reviewed the triage vital signs and the nursing notes.  Pertinent labs & imaging results that were available during my care of the patient were reviewed by me and considered in my medical decision making (see chart for details).     LCL sprain- knee sleeve given here in clinic RICE Ibuprofen for pain and inflammation.  She has a crutch that she can use as needed.  Ortho as needed.  Final Clinical Impressions(s) / UC Diagnoses   Final diagnoses:  Sprain of lateral collateral ligament of right knee, initial encounter     Discharge Instructions     This is most likely a knee sprain.  We will give you a knee brace to wear here in clinic. Rest, ice, elevate. Ibuprofen 600 mg every 6 hours Use the crutch as needed Follow up with orthopedics as needed.     ED Prescriptions    None     PDMP not reviewed this encounter.   Janace Aris, NP 10/13/19 [phone]

## 2020-01-16 ENCOUNTER — Other Ambulatory Visit: Payer: Self-pay | Admitting: Family Medicine

## 2020-05-18 ENCOUNTER — Other Ambulatory Visit: Payer: Self-pay

## 2020-05-18 ENCOUNTER — Encounter (HOSPITAL_COMMUNITY): Payer: Self-pay

## 2020-05-18 DIAGNOSIS — N390 Urinary tract infection, site not specified: Secondary | ICD-10-CM | POA: Insufficient documentation

## 2020-05-18 DIAGNOSIS — Z20822 Contact with and (suspected) exposure to covid-19: Secondary | ICD-10-CM | POA: Insufficient documentation

## 2020-05-18 LAB — COMPREHENSIVE METABOLIC PANEL
ALT: 15 U/L (ref 0–44)
AST: 22 U/L (ref 15–41)
Albumin: 4.6 g/dL (ref 3.5–5.0)
Alkaline Phosphatase: 93 U/L (ref 38–126)
Anion gap: 10 (ref 5–15)
BUN: 10 mg/dL (ref 6–20)
CO2: 26 mmol/L (ref 22–32)
Calcium: 9.2 mg/dL (ref 8.9–10.3)
Chloride: 100 mmol/L (ref 98–111)
Creatinine, Ser: 0.81 mg/dL (ref 0.44–1.00)
GFR calc Af Amer: >60 mL/min (ref >60)
GFR calc non Af Amer: >60 mL/min (ref >60)
Glucose, Bld: 107 mg/dL — ABNORMAL HIGH (ref 70–99)
Potassium: 3.9 mmol/L (ref 3.5–5.1)
Sodium: 136 mmol/L (ref 135–145)
Total Bilirubin: 1.1 mg/dL (ref 0.3–1.2)
Total Protein: 8.4 g/dL — ABNORMAL HIGH (ref 6.5–8.1)

## 2020-05-18 LAB — CBC
HCT: 45.1 % (ref 36.0–46.0)
Hemoglobin: 15.1 g/dL — ABNORMAL HIGH (ref 12.0–15.0)
MCH: 30.3 pg (ref 26.0–34.0)
MCHC: 33.5 g/dL (ref 30.0–36.0)
MCV: 90.4 fL (ref 80.0–100.0)
Platelets: 267 10*3/uL (ref 150–400)
RBC: 4.99 MIL/uL (ref 3.87–5.11)
RDW: 11.6 % (ref 11.5–15.5)
WBC: 7.1 10*3/uL (ref 4.0–10.5)
nRBC: 0 % (ref 0.0–0.2)

## 2020-05-18 LAB — URINALYSIS, ROUTINE W REFLEX MICROSCOPIC
Bilirubin Urine: NEGATIVE
Glucose, UA: NEGATIVE mg/dL
Hgb urine dipstick: NEGATIVE
Ketones, ur: 20 mg/dL — AB
Nitrite: NEGATIVE
Protein, ur: 30 mg/dL — AB
Specific Gravity, Urine: 1.018 (ref 1.005–1.030)
Squamous Epithelial / LPF: >50 — ABNORMAL HIGH (ref 0–5)
pH: 6 (ref 5.0–8.0)

## 2020-05-18 LAB — I-STAT BETA HCG BLOOD, ED (MC, WL, AP ONLY): I-stat hCG, quantitative: <5 m[IU]/mL (ref <5)

## 2020-05-18 LAB — LIPASE, BLOOD: Lipase: 27 U/L (ref 11–51)

## 2020-05-18 MED ORDER — SODIUM CHLORIDE 0.9% FLUSH
3.0000 mL | Freq: Once | INTRAVENOUS | Status: AC
  Administered 2020-05-19: 3 mL via INTRAVENOUS

## 2020-05-18 NOTE — ED Triage Notes (Signed)
Arrived POV from home. Patient reports left lower abdominal pain and nausea without vomiting since Tuesday. Patient's mother reports that patient is not anatomically correct so it may be her gall bladder. Patient also reports she has been sleeping a lot more over the past week.

## 2020-05-19 ENCOUNTER — Emergency Department (HOSPITAL_COMMUNITY)
Admission: EM | Admit: 2020-05-19 | Discharge: 2020-05-19 | Disposition: A | Discharge status: Home / Self Care | Payer: Self-pay | Attending: Emergency Medicine | Admitting: Emergency Medicine

## 2020-05-19 DIAGNOSIS — N390 Urinary tract infection, site not specified: Secondary | ICD-10-CM

## 2020-05-19 DIAGNOSIS — R1032 Left lower quadrant pain: Secondary | ICD-10-CM

## 2020-05-19 LAB — POC SARS CORONAVIRUS 2 AG -  ED: SARS Coronavirus 2 Ag: NEGATIVE

## 2020-05-19 MED ORDER — ONDANSETRON 4 MG PO TBDP: ORAL_TABLET | ORAL | 0 refills | Status: DC

## 2020-05-19 MED ORDER — SODIUM CHLORIDE 0.9 % IV BOLUS
1000.0000 mL | Freq: Once | INTRAVENOUS | Status: AC
  Administered 2020-05-19: 1000 mL via INTRAVENOUS

## 2020-05-19 MED ORDER — ONDANSETRON HCL 4 MG/2ML IJ SOLN
4.0000 mg | Freq: Once | INTRAMUSCULAR | Status: AC | PRN
  Administered 2020-05-19: 4 mg via INTRAVENOUS
  Filled 2020-05-19: qty 2

## 2020-05-19 MED ORDER — CEPHALEXIN 500 MG PO CAPS: 500.0000 mg | ORAL_CAPSULE | Freq: Four times a day (QID) | ORAL | 0 refills | Status: DC

## 2020-05-19 MED ORDER — SODIUM CHLORIDE 0.9 % IV SOLN
1.0000 g | Freq: Once | INTRAVENOUS | Status: AC
  Administered 2020-05-19: 1 g via INTRAVENOUS
  Filled 2020-05-19: qty 10

## 2020-05-19 NOTE — Discharge Instructions (Signed)
1. Medications: Keflex, zofran, usual home medications °2. Treatment: rest, drink plenty of fluids, take medications as prescribed °3. Follow Up: Please followup with your primary doctor in 3 days for discussion of your diagnoses and further evaluation after today's visit; if you do not have a primary care doctor use the resource guide provided to find one; return to the ER for fevers, persistent vomiting, worsening abdominal pain or other concerning symptoms. ° °

## 2020-05-19 NOTE — ED Provider Notes (Signed)
[facility]-EMERGENCY DEPT Provider Note   CSN: [id number] Arrival date & time: 05/18/20  2117     History Chief Complaint  Patient presents with  . Nausea  . Abdominal Pain    left lower    Vickie Landry is a 19 y.o. female with a hx of appendectomy (2015) presents to the Emergency Department complaining of gradual, persistent, progressively worsening nausea and fatigue onset 3 days ago. Associated symptoms include intermittent LLQ abd pain and anorexia.  Pt reports severe nausea regardless of whether or not she eats.  No vomiting or diarrhea.  Last BM was this morning and normal.  LMP 3 weeks ago.  Pt denies vaginal or urinary symptoms.  Mother at bedside reports pt does not usually have symptoms with UTI.  No fever, chills, headache, neck pain, chest pain, SOB, weakness, dizziness, syncope, back pain.  No aggravating or alleviating factors.       The history is provided by the patient and medical records. No language interpreter was used.       Past Medical History:  Diagnosis Date  . Collar bone fracture     Patient Active Problem List   Diagnosis Date Noted  . Appendicitis, acute 11/18/2014  . Acute appendicitis 11/17/2014    Past Surgical History:  Procedure Laterality Date  . APPENDECTOMY    . LAPAROSCOPIC APPENDECTOMY N/A 11/18/2014   Procedure: APPENDECTOMY LAPAROSCOPIC;  Surgeon: Judie Petit. Leonia Corona, MD;  Location: [facility] OR;  Service: Pediatrics;  Laterality: N/A;     OB History   No obstetric history on file.     Family History  Problem Relation Age of Onset  . Healthy Mother   . Healthy Father     Social History   Tobacco Use  . Smoking status: Never Smoker  . Smokeless tobacco: Never Used  Substance Use Topics  . Alcohol use: Yes  . Drug use: Not Currently    Home Medications Prior to Admission medications   Medication Sig Start Date End Date Taking? Authorizing Provider  acetaminophen (TYLENOL) 500 MG tablet Take 1,000 mg  by mouth every 6 (six) hours as needed.    [provider]  cephALEXin (KEFLEX) 500 MG capsule Take 1 capsule (500 mg total) by mouth 4 (four) times daily. 05/19/20   Denika Krone, Dahlia Client, PA-C  ondansetron (ZOFRAN ODT) 4 MG disintegrating tablet 4mg  ODT q4 hours prn nausea/vomit 05/19/20   Harwood Nall, 07/19/20, PA-C  fexofenadine (ALLEGRA) 60 MG tablet Take 1 tablet (60 mg total) by mouth daily. Patient not taking: Reported on 08/05/2019 07/28/17 10/11/19  Maczis, 10/13/19, PA-C  fluticasone [facility]) 50 MCG/ACT nasal spray Place 2 sprays into both nostrils daily. Patient not taking: Reported on 08/05/2019 07/28/17 10/11/19  10/13/19, PA-C    Allergies    Other  Review of Systems   Review of Systems  Constitutional: Negative for appetite change, diaphoresis, fatigue, fever and unexpected weight change.  HENT: Negative for mouth sores.   Eyes: Negative for visual disturbance.  Respiratory: Negative for cough, chest tightness, shortness of breath and wheezing.   Cardiovascular: Negative for chest pain.  Gastrointestinal: Positive for abdominal pain and nausea. Negative for constipation, diarrhea and vomiting.  Endocrine: Negative for polydipsia, polyphagia and polyuria.  Genitourinary: Negative for dysuria, frequency, hematuria and urgency.  Musculoskeletal: Negative for back pain and neck stiffness.  Skin: Negative for rash.  Allergic/Immunologic: Negative for immunocompromised state.  Neurological: Negative for syncope, light-headedness and headaches.  Hematological: Does not bruise/bleed easily.  Psychiatric/Behavioral: Negative for sleep disturbance. The patient is not nervous/anxious.     Physical Exam Updated Vital Signs BP 120/89 (BP Location: Left Arm)   Pulse 100   Temp 98.7 F (37.1 C) (Oral)   Resp 16   LMP 05/01/2020   SpO2 98%   Physical Exam Vitals and nursing note reviewed.  Constitutional:      General: She is not in acute distress.    Appearance:  She is not diaphoretic.  HENT:     Head: Normocephalic.     Mouth/Throat:     Mouth: Mucous membranes are dry.  Eyes:     General: No scleral icterus.    Conjunctiva/sclera: Conjunctivae normal.  Cardiovascular:     Rate and Rhythm: Regular rhythm. Tachycardia present.     Pulses: Normal pulses.          Radial pulses are 2+ on the right side and 2+ on the left side.     Comments: Mild tachycardia  Pulmonary:     Effort: No tachypnea, accessory muscle usage, prolonged expiration, respiratory distress or retractions.     Breath sounds: Normal breath sounds. No stridor.     Comments: Equal chest rise. No increased work of breathing. Abdominal:     General: There is no distension.     Palpations: Abdomen is soft.     Tenderness: There is no abdominal tenderness. There is no right CVA tenderness, left CVA tenderness, guarding or rebound.     Hernia: No hernia is present.  Musculoskeletal:     Cervical back: Normal range of motion.     Comments: Moves all extremities equally and without difficulty.  Skin:    General: Skin is warm and dry.     Capillary Refill: Capillary refill takes less than 2 seconds.  Neurological:     Mental Status: She is alert.     GCS: GCS eye subscore is 4. GCS verbal subscore is 5. GCS motor subscore is 6.     Comments: Speech is clear and goal oriented.  Psychiatric:        Mood and Affect: Mood normal.     ED Results / Procedures / Treatments   Labs (all labs ordered are listed, but only abnormal results are displayed) Labs Reviewed  COMPREHENSIVE METABOLIC PANEL - Abnormal; Notable for the following components:      Result Value   Glucose, Bld 107 (*)    Total Protein 8.4 (*)    All other components within normal limits  CBC - Abnormal; Notable for the following components:   Hemoglobin 15.1 (*)    All other components within normal limits  URINALYSIS, ROUTINE W REFLEX MICROSCOPIC - Abnormal; Notable for the following components:   Color,  Urine AMBER (*)    APPearance TURBID (*)    Ketones, ur 20 (*)    Protein, ur 30 (*)    Leukocytes,Ua MODERATE (*)    Bacteria, UA RARE (*)    Squamous Epithelial / LPF >50 (*)    All other components within normal limits  URINE CULTURE  LIPASE, BLOOD  I-STAT BETA HCG BLOOD, ED ([facility], [facility], [facility] ONLY)  POC SARS CORONAVIRUS 2 AG -  ED    Procedures Procedures (including critical care time)  Medications Ordered in ED Medications  sodium chloride flush (NS) 0.9 % injection 3 mL (3 mLs Intravenous Given 05/19/20 0032)  ondansetron (ZOFRAN) injection 4 mg (4 mg Intravenous Given 05/19/20 0032)  sodium chloride 0.9 % bolus 1,000 mL (  0 mLs Intravenous Stopped 05/19/20 0248)  cefTRIAXone (ROCEPHIN) 1 g in sodium chloride 0.9 % 100 mL IVPB (0 g Intravenous Stopped 05/19/20 0231)    ED Course  I have reviewed the triage vital signs and the nursing notes.  Pertinent labs & imaging results that were available during my care of the patient were reviewed by me and considered in my medical decision making (see chart for details).    MDM Rules/Calculators/A&P                       Pt presents with LLQ abd pain and nausea.  Labs reassuring.  Mild dehydration.  Fluids and zofran given.  Pt reports the zofran has helped her symptoms.  No abd pain at this time.  Abdomen soft and nontender on my exam.  UA concerning for UTI.  Rocephin given here in the department.  Urine culture sent.  Less likely to be diverticulitis, PID or ovarian torsion.  2:53 AM On repeat exam abdomen is soft and nontender.  Patient has tolerated p.o. here in the emergency department without difficulty.  No return of nausea or vomiting.  Initial tachycardia noted at triage has improved after fluids.  Patient will be discharged home with Keflex and Zofran.  Discussed reasons to return immediately to the emergency department including return of pain, vomiting, fevers or other concerns.  Patient states understanding and is in agreement with the  plan.   Final Clinical Impression(s) / ED Diagnoses Final diagnoses:  Urinary tract infection without hematuria, site unspecified  Left lower quadrant abdominal pain    Rx / DC Orders ED Discharge Orders         Ordered    ondansetron (ZOFRAN ODT) 4 MG disintegrating tablet     05/19/20 0251    cephALEXin (KEFLEX) 500 MG capsule  4 times daily     05/19/20 0251           Vickie Landry, Dahlia Client, PA-C 05/19/20 0254    Mesner, Barbara Cower, MD 05/19/20 2876

## 2020-05-21 LAB — URINE CULTURE

## 2020-07-26 ENCOUNTER — Ambulatory Visit
Admission: EM | Admit: 2020-07-26 | Discharge: 2020-07-26 | Disposition: A | Discharge status: Home / Self Care | Payer: Self-pay | Attending: Physician Assistant | Admitting: Physician Assistant

## 2020-07-26 ENCOUNTER — Encounter: Payer: Self-pay | Admitting: Physician Assistant

## 2020-07-26 DIAGNOSIS — R110 Nausea: Secondary | ICD-10-CM

## 2020-07-26 DIAGNOSIS — N898 Other specified noninflammatory disorders of vagina: Secondary | ICD-10-CM

## 2020-07-26 DIAGNOSIS — N3090 Cystitis, unspecified without hematuria: Secondary | ICD-10-CM

## 2020-07-26 LAB — POCT URINALYSIS DIP (MANUAL ENTRY)
Bilirubin, UA: NEGATIVE
Glucose, UA: NEGATIVE mg/dL
Ketones, POC UA: NEGATIVE mg/dL
Nitrite, UA: NEGATIVE
Protein Ur, POC: NEGATIVE mg/dL
Spec Grav, UA: 1.025 (ref 1.010–1.025)
Urobilinogen, UA: 0.2 E.U./dL
pH, UA: 5.5 (ref 5.0–8.0)

## 2020-07-26 MED ORDER — CEPHALEXIN 500 MG PO CAPS: 500.0000 mg | ORAL_CAPSULE | Freq: Two times a day (BID) | ORAL | 0 refills | Status: DC

## 2020-07-26 MED ORDER — FLUCONAZOLE 150 MG PO TABS: 150.0000 mg | ORAL_TABLET | Freq: Every day | ORAL | 0 refills | Status: DC

## 2020-07-26 MED ORDER — ONDANSETRON 4 MG PO TBDP: 4.0000 mg | ORAL_TABLET | Freq: Three times a day (TID) | ORAL | 0 refills | Status: DC | PRN

## 2020-07-26 NOTE — Discharge Instructions (Signed)
Your urine was positive for an urinary tract infection. Start keflex as directed. Zofran as needed for nausea/vomiting. Keep hydrated, urine should be clear to pale yellow in color.   Since we are starting you on an antibiotic, I prescribed you diflucan, which will cover for yeast. If vaginal irritation/dryness is due to yeast, antibiotic can make it worse, take diflucan if this happens. Otherwise, testing sent, you will be contacted with any positive results. Refrain from hygiene product for now.  If worsening symptoms, fever, worsening abdominal pain, nausea/vomiting not controlled by medicine, go to the ED for further evaluation.

## 2020-07-26 NOTE — ED Provider Notes (Signed)
[facility]    CSN: [id number] Arrival date & time: 07/26/20  1558      History   Chief Complaint Chief Complaint  Patient presents with  . Abdominal Pain  . Nausea    HPI Vickie Landry is a 19 y.o. female.   19 year old female comes in for 2 day history of nausea, fatigue, LLQ pain. States had these symptoms in the past, and was found to have an UTI. She denies any obvious urinary changes. Denies diarrhea, constipation. Denies fever, chills, flank pain. Has mild vaginal dryness. States has baseline vaginal discharge that has slightly increased. Denies odors. Sexually active with one female partner. No new hygiene product changes. LMP 07/05/2020     Past Medical History:  Diagnosis Date  . Collar bone fracture     Patient Active Problem List   Diagnosis Date Noted  . Appendicitis, acute 11/18/2014  . Acute appendicitis 11/17/2014    Past Surgical History:  Procedure Laterality Date  . APPENDECTOMY    . LAPAROSCOPIC APPENDECTOMY N/A 11/18/2014   Procedure: APPENDECTOMY LAPAROSCOPIC;  Surgeon: Judie Petit. Leonia Corona, MD;  Location: [facility] OR;  Service: Pediatrics;  Laterality: N/A;    OB History   No obstetric history on file.      Home Medications    Prior to Admission medications   Medication Sig Start Date End Date Taking? Authorizing Provider  acetaminophen (TYLENOL) 500 MG tablet Take 1,000 mg by mouth every 6 (six) hours as needed.    [provider]  cephALEXin (KEFLEX) 500 MG capsule Take 1 capsule (500 mg total) by mouth 2 (two) times daily. 07/26/20   Cathie Hoops, Yanixan Mellinger V, PA-C  fluconazole (DIFLUCAN) 150 MG tablet Take 1 tablet (150 mg total) by mouth daily. Take second dose 72 hours later if symptoms still persists. 07/26/20   Cathie Hoops, Jahden Schara V, PA-C  ondansetron (ZOFRAN ODT) 4 MG disintegrating tablet Take 1 tablet (4 mg total) by mouth every 8 (eight) hours as needed for nausea or vomiting. 07/26/20   Cathie Hoops, Symiah Nowotny V, PA-C  fexofenadine (ALLEGRA) 60 MG tablet  Take 1 tablet (60 mg total) by mouth daily. Patient not taking: Reported on 08/05/2019 07/28/17 10/11/19  Maczis, Elmer Sow, PA-C  fluticasone [facility]) 50 MCG/ACT nasal spray Place 2 sprays into both nostrils daily. Patient not taking: Reported on 08/05/2019 07/28/17 10/11/19  Maczis, Elmer Sow, PA-C    Family History Family History  Problem Relation Age of Onset  . Healthy Mother   . Healthy Father     Social History Social History   Tobacco Use  . Smoking status: Never Smoker  . Smokeless tobacco: Never Used  Vaping Use  . Vaping Use: Every day  . Substances: Nicotine  Substance Use Topics  . Alcohol use: Yes  . Drug use: Not Currently     Allergies   Other   Review of Systems Review of Systems  Reason unable to perform ROS: See HPI as above.     Physical Exam Triage Vital Signs ED Triage Vitals [07/26/20 1715]  Enc Vitals Group     BP 120/78     Pulse Rate 100     Resp 16     Temp 98.8 F (37.1 C)     Temp Source Oral     SpO2 98 %     Weight      Height      Head Circumference      Peak Flow      Pain  Score      Pain Loc      Pain Edu?      Excl. in GC?    No data found.  Updated Vital Signs BP 120/78 (BP Location: Left Arm)   Pulse 100   Temp 98.8 F (37.1 C) (Oral)   Resp 16   LMP 07/04/2020   SpO2 98%   Physical Exam Constitutional:      General: She is not in acute distress.    Appearance: She is well-developed. She is not ill-appearing, toxic-appearing or diaphoretic.  HENT:     Head: Normocephalic and atraumatic.  Eyes:     Conjunctiva/sclera: Conjunctivae normal.     Pupils: Pupils are equal, round, and reactive to light.  Cardiovascular:     Rate and Rhythm: Normal rate and regular rhythm.  Pulmonary:     Effort: Pulmonary effort is normal. No respiratory distress.     Comments: LCTAB Abdominal:     General: Bowel sounds are normal.     Palpations: Abdomen is soft.     Tenderness: There is no abdominal tenderness. There is  no right CVA tenderness, left CVA tenderness, guarding or rebound.  Musculoskeletal:     Cervical back: Normal range of motion and neck supple.  Skin:    General: Skin is warm and dry.  Neurological:     Mental Status: She is alert and oriented to person, place, and time.  Psychiatric:        Behavior: Behavior normal.        Judgment: Judgment normal.      UC Treatments / Results  Labs (all labs ordered are listed, but only abnormal results are displayed) Labs Reviewed  POCT URINALYSIS DIP (MANUAL ENTRY) - Abnormal; Notable for the following components:      Result Value   Blood, UA small (*)    Leukocytes, UA Small (1+) (*)    All other components within normal limits  URINE CULTURE  CERVICOVAGINAL ANCILLARY ONLY    EKG   Radiology No results found.  Procedures Procedures (including critical care time)  Medications Ordered in UC Medications - No data to display  Initial Impression / Assessment and Plan / UC Course  I have reviewed the triage vital signs and the nursing notes.  Pertinent labs & imaging results that were available during my care of the patient were reviewed by me and considered in my medical decision making (see chart for details).    Dipstick with small blood/leukocytes. Although without urinary symptoms, states current symptoms consistent with past UTIs. Will cover for cystitis with keflex. Cytology sent. Push fluids. Return precautions given.   Final Clinical Impressions(s) / UC Diagnoses   Final diagnoses:  Cystitis  Nausea without vomiting  Vaginal discharge   ED Prescriptions    Medication Sig Dispense Auth. Provider   cephALEXin (KEFLEX) 500 MG capsule Take 1 capsule (500 mg total) by mouth 2 (two) times daily. 10 capsule Elizet Kaplan V, PA-C   fluconazole (DIFLUCAN) 150 MG tablet Take 1 tablet (150 mg total) by mouth daily. Take second dose 72 hours later if symptoms still persists. 2 tablet Nastassia Bazaldua V, PA-C   ondansetron (ZOFRAN ODT) 4 MG  disintegrating tablet Take 1 tablet (4 mg total) by mouth every 8 (eight) hours as needed for nausea or vomiting. 20 tablet Belinda Fisher, PA-C     PDMP not reviewed this encounter.   Belinda Fisher, PA-C 07/26/20 1752

## 2020-07-26 NOTE — ED Triage Notes (Signed)
Pt c/o nausea since Sunday and nausea onset today.  Denies dysuria sx, fever, chills, v/d, flank/back pain.

## 2020-07-28 ENCOUNTER — Telehealth (HOSPITAL_COMMUNITY): Payer: Self-pay | Admitting: *Deleted

## 2020-07-28 LAB — CERVICOVAGINAL ANCILLARY ONLY
Bacterial Vaginitis (gardnerella): POSITIVE — AB
Candida Glabrata: NEGATIVE
Candida Vaginitis: POSITIVE — AB
Chlamydia: NEGATIVE
Comment: NEGATIVE
Comment: NEGATIVE
Comment: NEGATIVE
Comment: NEGATIVE
Comment: NEGATIVE
Comment: NORMAL
Neisseria Gonorrhea: NEGATIVE
Trichomonas: NEGATIVE

## 2020-07-28 LAB — URINE CULTURE

## 2020-07-28 NOTE — Telephone Encounter (Signed)
Patient will need tx for BV and yeast if still having symptoms. Called x 1, VM left.   Metronidazole 500mg  BID x 7 days Diflucan 150mg  PO x 1 dose, repeat in 3 days if needed. Dispense 2.

## 2020-07-31 ENCOUNTER — Ambulatory Visit
Admission: EM | Admit: 2020-07-31 | Discharge: 2020-07-31 | Disposition: A | Discharge status: Home / Self Care | Payer: Self-pay | Attending: Physician Assistant | Admitting: Physician Assistant

## 2020-07-31 DIAGNOSIS — R112 Nausea with vomiting, unspecified: Secondary | ICD-10-CM

## 2020-07-31 DIAGNOSIS — B3731 Acute candidiasis of vulva and vagina: Secondary | ICD-10-CM

## 2020-07-31 DIAGNOSIS — B373 Candidiasis of vulva and vagina: Secondary | ICD-10-CM

## 2020-07-31 MED ORDER — ONDANSETRON 4 MG PO TBDP: 4.0000 mg | ORAL_TABLET | Freq: Three times a day (TID) | ORAL | 0 refills | Status: AC | PRN

## 2020-07-31 MED ORDER — FAMOTIDINE 20 MG PO TABS: 20.0000 mg | ORAL_TABLET | Freq: Two times a day (BID) | ORAL | 0 refills | Status: AC

## 2020-07-31 MED ORDER — ONDANSETRON 4 MG PO TBDP
4.0000 mg | ORAL_TABLET | Freq: Once | ORAL | Status: AC
  Administered 2020-07-31: 4 mg via ORAL

## 2020-07-31 NOTE — Discharge Instructions (Signed)
You can discontinue keflex. Diflucan as directed for yeast. Zofran and pepcid for nausea. Follow up with GYN/PCP if nausea does not resolve for further evaluation.

## 2020-07-31 NOTE — ED Provider Notes (Signed)
[facility]    CSN: [id number] Arrival date & time: 07/31/20  1826      History   Chief Complaint Chief Complaint  Patient presents with  . Nausea    HPI Vickie Landry is a 19 y.o. female.   19 year old female comes in for continued nausea after being seen 07/26/2020. At the time, states nausea is usually symptoms of UTIs for her, had leuks in urine and started on keflex. Urine culture with multiple species. Continued keflex without improvement of symptoms. Cytology at the time tested positive for BV, yeast. Patient picked up Rx of diflucan, but has not started medicine. No vomiting. No fevers. States has had some lower abdominal cramping but expecting menstrual onset. Nausea worse after oral intake     Past Medical History:  Diagnosis Date  . Collar bone fracture     Patient Active Problem List   Diagnosis Date Noted  . Appendicitis, acute 11/18/2014  . Acute appendicitis 11/17/2014    Past Surgical History:  Procedure Laterality Date  . APPENDECTOMY    . LAPAROSCOPIC APPENDECTOMY N/A 11/18/2014   Procedure: APPENDECTOMY LAPAROSCOPIC;  Surgeon: Judie Petit. Leonia Corona, MD;  Location: [facility] OR;  Service: Pediatrics;  Laterality: N/A;    OB History   No obstetric history on file.      Home Medications    Prior to Admission medications   Medication Sig Start Date End Date Taking? Authorizing Provider  acetaminophen (TYLENOL) 500 MG tablet Take 1,000 mg by mouth every 6 (six) hours as needed.    [provider]  cephALEXin (KEFLEX) 500 MG capsule Take 1 capsule (500 mg total) by mouth 2 (two) times daily. 07/26/20   Cathie Hoops, Kaileia Flow V, PA-C  famotidine (PEPCID) 20 MG tablet Take 1 tablet (20 mg total) by mouth 2 (two) times daily. 07/31/20   Cathie Hoops, Tamera Pingley V, PA-C  fluconazole (DIFLUCAN) 150 MG tablet Take 1 tablet (150 mg total) by mouth daily. Take second dose 72 hours later if symptoms still persists. 07/26/20   Cathie Hoops, Deandre Stansel V, PA-C  ondansetron (ZOFRAN ODT) 4 MG  disintegrating tablet Take 1 tablet (4 mg total) by mouth every 8 (eight) hours as needed for nausea or vomiting. 07/31/20   Cathie Hoops, Eliga Arvie V, PA-C  fexofenadine (ALLEGRA) 60 MG tablet Take 1 tablet (60 mg total) by mouth daily. Patient not taking: Reported on 08/05/2019 07/28/17 10/11/19  Maczis, Elmer Sow, PA-C  fluticasone [facility]) 50 MCG/ACT nasal spray Place 2 sprays into both nostrils daily. Patient not taking: Reported on 08/05/2019 07/28/17 10/11/19  Maczis, Elmer Sow, PA-C    Family History Family History  Problem Relation Age of Onset  . Healthy Mother   . Healthy Father     Social History Social History   Tobacco Use  . Smoking status: Never Smoker  . Smokeless tobacco: Never Used  Vaping Use  . Vaping Use: Every day  . Substances: Nicotine  Substance Use Topics  . Alcohol use: Yes  . Drug use: Not Currently     Allergies   Other   Review of Systems Review of Systems  Reason unable to perform ROS: See HPI as above.     Physical Exam Triage Vital Signs ED Triage Vitals  Enc Vitals Group     BP 07/31/20 1953 120/72     Pulse Rate 07/31/20 1953 93     Resp 07/31/20 1953 18     Temp 07/31/20 1953 98.7 F (37.1 C)     Temp  Source 07/31/20 1953 Oral     SpO2 07/31/20 1953 98 %     Weight --      Height --      Head Circumference --      Peak Flow --      Pain Score 07/31/20 1954 0     Pain Loc --      Pain Edu? --      Excl. in GC? --    No data found.  Updated Vital Signs BP 120/72 (BP Location: Left Arm)   Pulse 93   Temp 98.7 F (37.1 C) (Oral)   Resp 18   LMP 07/04/2020   SpO2 98%    Physical Exam Constitutional:      General: She is not in acute distress.    Appearance: She is well-developed. She is not ill-appearing, toxic-appearing or diaphoretic.  HENT:     Head: Normocephalic and atraumatic.  Eyes:     Conjunctiva/sclera: Conjunctivae normal.     Pupils: Pupils are equal, round, and reactive to light.  Cardiovascular:     Rate and  Rhythm: Normal rate and regular rhythm.  Pulmonary:     Effort: Pulmonary effort is normal. No respiratory distress.     Comments: LCTAB Abdominal:     General: Bowel sounds are normal.     Palpations: Abdomen is soft.     Tenderness: There is no abdominal tenderness. There is no right CVA tenderness, left CVA tenderness, guarding or rebound.  Musculoskeletal:     Cervical back: Normal range of motion and neck supple.  Skin:    General: Skin is warm and dry.  Neurological:     Mental Status: She is alert and oriented to person, place, and time.  Psychiatric:        Behavior: Behavior normal.        Judgment: Judgment normal.      UC Treatments / Results  Labs (all labs ordered are listed, but only abnormal results are displayed) Labs Reviewed - No data to display  EKG   Radiology No results found.  Procedures Procedures (including critical care time)  Medications Ordered in UC Medications  ondansetron (ZOFRAN-ODT) disintegrating tablet 4 mg (4 mg Oral Given 07/31/20 2021)    Initial Impression / Assessment and Plan / UC Course  I have reviewed the triage vital signs and the nursing notes.  Pertinent labs & imaging results that were available during my care of the patient were reviewed by me and considered in my medical decision making (see chart for details).    No alarming signs on exam. zofran sent to [facility] with goodrx use to help with cost. pepcid for possible gastritis. Diflucan for yeast. Will defer flagyl right now due to nausea. Patient will need PCP follow up if nausea does not resolve. Return precautions given.  Final Clinical Impressions(s) / UC Diagnoses   Final diagnoses:  Intractable vomiting with nausea, unspecified vomiting type  Yeast vaginitis   ED Prescriptions    Medication Sig Dispense Auth. Provider   ondansetron (ZOFRAN ODT) 4 MG disintegrating tablet Take 1 tablet (4 mg total) by mouth every 8 (eight) hours as needed for nausea or  vomiting. 20 tablet Sebastyan Snodgrass V, PA-C   famotidine (PEPCID) 20 MG tablet Take 1 tablet (20 mg total) by mouth 2 (two) times daily. 28 tablet Belinda Fisher, PA-C     PDMP not reviewed this encounter.   Belinda Fisher, PA-C 07/31/20 2217

## 2020-07-31 NOTE — ED Triage Notes (Signed)
Pt c/o feeling nausea for over a week. States was treated for a UTI and has 1 dose of antibiotics left and still feeling nausea. States was unable to get the nausea med filled d/t cost.

## 2020-08-15 ENCOUNTER — Other Ambulatory Visit: Payer: Self-pay

## 2020-08-15 ENCOUNTER — Ambulatory Visit
Admission: EM | Admit: 2020-08-15 | Discharge: 2020-08-15 | Disposition: A | Discharge status: Home / Self Care | Payer: HRSA Program | Attending: Emergency Medicine | Admitting: Emergency Medicine

## 2020-08-15 DIAGNOSIS — U071 COVID-19: Secondary | ICD-10-CM | POA: Diagnosis present

## 2020-08-15 LAB — POCT RAPID STREP A (OFFICE): Rapid Strep A Screen: NEGATIVE

## 2020-08-15 NOTE — ED Triage Notes (Signed)
See downtime forms.

## 2020-08-17 LAB — NOVEL CORONAVIRUS, NAA: SARS-CoV-2, NAA: DETECTED — AB

## 2020-08-18 LAB — CULTURE, GROUP A STREP (THRC)

## 2020-08-29 ENCOUNTER — Other Ambulatory Visit: Payer: Self-pay | Admitting: Critical Care Medicine

## 2020-08-29 ENCOUNTER — Other Ambulatory Visit: Payer: Self-pay

## 2020-08-29 DIAGNOSIS — Z20822 Contact with and (suspected) exposure to covid-19: Secondary | ICD-10-CM

## 2020-08-31 LAB — SARS-COV-2, NAA 2 DAY TAT

## 2020-08-31 LAB — NOVEL CORONAVIRUS, NAA: SARS-CoV-2, NAA: NOT DETECTED

## 2021-04-14 ENCOUNTER — Ambulatory Visit
Admission: EM | Admit: 2021-04-14 | Discharge: 2021-04-14 | Disposition: A | Discharge status: Home / Self Care | Payer: 59 | Attending: Emergency Medicine | Admitting: Emergency Medicine

## 2021-04-14 ENCOUNTER — Other Ambulatory Visit: Payer: Self-pay

## 2021-04-14 ENCOUNTER — Ambulatory Visit (INDEPENDENT_AMBULATORY_CARE_PROVIDER_SITE_OTHER): Payer: 59

## 2021-04-14 ENCOUNTER — Encounter: Payer: Self-pay | Admitting: Emergency Medicine

## 2021-04-14 DIAGNOSIS — S9001XA Contusion of right ankle, initial encounter: Secondary | ICD-10-CM | POA: Diagnosis not present

## 2021-04-14 DIAGNOSIS — M25571 Pain in right ankle and joints of right foot: Secondary | ICD-10-CM

## 2021-04-14 MED ORDER — IBUPROFEN 800 MG PO TABS: 800.0000 mg | ORAL_TABLET | Freq: Three times a day (TID) | ORAL | 0 refills | Status: AC

## 2021-04-14 NOTE — ED Triage Notes (Signed)
Pt here for right ankle pain after twisting while walking 3 days ago

## 2021-04-14 NOTE — Discharge Instructions (Addendum)
X-ray normal Ibuprofen and Tylenol for pain and swelling Ice and elevate May use Ace wrap for compression/support Follow-up if not improving or worsening

## 2021-04-14 NOTE — ED Provider Notes (Signed)
[facility]    CSN: [id number] Arrival date & time: 04/14/21  1412      History   Chief Complaint Chief Complaint  Patient presents with  . Ankle Pain    HPI Vickie Landry is a 20 y.o. female presenting today for evaluation of right ankle injury.   Patient accidentally hit front portion of ankle on a piece of metal.  Denies any rolling or twisting injury of ankle.  Has developed bruising and swelling to ankle.  HPI  Past Medical History:  Diagnosis Date  . Collar bone fracture     Patient Active Problem List   Diagnosis Date Noted  . Appendicitis, acute 11/18/2014  . Acute appendicitis 11/17/2014    Past Surgical History:  Procedure Laterality Date  . APPENDECTOMY    . LAPAROSCOPIC APPENDECTOMY N/A 11/18/2014   Procedure: APPENDECTOMY LAPAROSCOPIC;  Surgeon: Judie Petit. Leonia Corona, MD;  Location: [facility] OR;  Service: Pediatrics;  Laterality: N/A;    OB History   No obstetric history on file.      Home Medications    Prior to Admission medications   Medication Sig Start Date End Date Taking? Authorizing Provider  ibuprofen (ADVIL) 800 MG tablet Take 1 tablet (800 mg total) by mouth 3 (three) times daily. 04/14/21  Yes Karlita Lichtman C, PA-C  acetaminophen (TYLENOL) 500 MG tablet Take 1,000 mg by mouth every 6 (six) hours as needed.    [provider]  famotidine (PEPCID) 20 MG tablet Take 1 tablet (20 mg total) by mouth 2 (two) times daily. 07/31/20   Cathie Hoops, Amy V, PA-C  ondansetron (ZOFRAN ODT) 4 MG disintegrating tablet Take 1 tablet (4 mg total) by mouth every 8 (eight) hours as needed for nausea or vomiting. 07/31/20   Cathie Hoops, Amy V, PA-C  fexofenadine (ALLEGRA) 60 MG tablet Take 1 tablet (60 mg total) by mouth daily. Patient not taking: Reported on 08/05/2019 07/28/17 10/11/19  Maczis, Elmer Sow, PA-C  fluticasone [facility]) 50 MCG/ACT nasal spray Place 2 sprays into both nostrils daily. Patient not taking: Reported on 08/05/2019 07/28/17 10/11/19  Maczis,  Elmer Sow, PA-C    Family History Family History  Problem Relation Age of Onset  . Healthy Mother   . Healthy Father     Social History Social History   Tobacco Use  . Smoking status: Never Smoker  . Smokeless tobacco: Never Used  Vaping Use  . Vaping Use: Every day  . Substances: Nicotine  Substance Use Topics  . Alcohol use: Yes  . Drug use: Not Currently     Allergies   Other   Review of Systems Review of Systems  Constitutional: Negative for fatigue and fever.  HENT: Negative for mouth sores.   Eyes: Negative for visual disturbance.  Respiratory: Negative for shortness of breath.   Cardiovascular: Negative for chest pain.  Gastrointestinal: Negative for abdominal pain, nausea and vomiting.  Genitourinary: Negative for genital sores.  Musculoskeletal: Positive for arthralgias, gait problem and joint swelling.  Skin: Negative for color change, rash and wound.  Neurological: Negative for dizziness, weakness, light-headedness and headaches.     Physical Exam Triage Vital Signs ED Triage Vitals  Enc Vitals Group     BP 04/14/21 1439 108/70     Pulse Rate 04/14/21 1439 99     Resp 04/14/21 1439 18     Temp 04/14/21 1439 98.9 F (37.2 C)     Temp Source 04/14/21 1439 Oral     SpO2 04/14/21 1439 98 %  Weight --      Height --      Head Circumference --      Peak Flow --      Pain Score 04/14/21 1440 5     Pain Loc --      Pain Edu? --      Excl. in GC? --    No data found.  Updated Vital Signs BP 108/70 (BP Location: Left Arm)   Pulse 99   Temp 98.9 F (37.2 C) (Oral)   Resp 18   SpO2 98%   Visual Acuity Right Eye Distance:   Left Eye Distance:   Bilateral Distance:    Right Eye Near:   Left Eye Near:    Bilateral Near:     Physical Exam Vitals and nursing note reviewed.  Constitutional:      Appearance: She is well-developed.     Comments: No acute distress  HENT:     Head: Normocephalic and atraumatic.     Nose: Nose normal.   Eyes:     Conjunctiva/sclera: Conjunctivae normal.  Cardiovascular:     Rate and Rhythm: Normal rate.  Pulmonary:     Effort: Pulmonary effort is normal. No respiratory distress.  Abdominal:     General: There is no distension.  Musculoskeletal:        General: Normal range of motion.     Cervical back: Neck supple.     Comments: Right ankle: Mild bruising and swelling noted to anterior ankle, no swelling or tenderness over the medial or lateral malleolus, nontender throughout dorsum of foot, full active range of motion, mild antalgic  Skin:    General: Skin is warm and dry.  Neurological:     Mental Status: She is alert and oriented to person, place, and time.      UC Treatments / Results  Labs (all labs ordered are listed, but only abnormal results are displayed) Labs Reviewed - No data to display  EKG   Radiology DG Ankle Complete Right  Result Date: 04/14/2021 CLINICAL DATA:  RIGHT ankle injury, pain.  Limited range of motion. EXAM: RIGHT ANKLE - COMPLETE 3+ VIEW COMPARISON:  None. FINDINGS: Osseous alignment is normal. Ankle mortise is symmetric. No fracture line or displaced fracture fragment is seen. Visualized portions of the hindfoot and midfoot appear intact and normally aligned. Adjacent soft tissues are unremarkable. IMPRESSION: Negative. Electronically Signed   By: Bary Richard M.D.   On: 04/14/2021 15:14    Procedures Procedures (including critical care time)  Medications Ordered in UC Medications - No data to display  Initial Impression / Assessment and Plan / UC Course  I have reviewed the triage vital signs and the nursing notes.  Pertinent labs & imaging results that were available during my care of the patient were reviewed by me and considered in my medical decision making (see chart for details).     X-rays negative for fracture, treating as contusion, Ace wrap applied, recommend anti-inflammatories and ice and elevation.  Discussed strict  return precautions. Patient verbalized understanding and is agreeable with plan.  Final Clinical Impressions(s) / UC Diagnoses   Final diagnoses:  Contusion of right ankle, initial encounter     Discharge Instructions     X-ray normal Ibuprofen and Tylenol for pain and swelling Ice and elevate May use Ace wrap for compression/support Follow-up if not improving or worsening    ED Prescriptions    Medication Sig Dispense Auth. Provider   ibuprofen (ADVIL) 800 MG  tablet Take 1 tablet (800 mg total) by mouth 3 (three) times daily. 21 tablet Mazzy Santarelli, [place] C, PA-C     PDMP not reviewed this encounter.   Lew Dawes, [place] 04/15/21 [phone]

## 2023-09-15 ENCOUNTER — Ambulatory Visit
Admission: EM | Admit: 2023-09-15 | Discharge: 2023-09-15 | Disposition: A | Discharge status: Home / Self Care | Payer: Medicaid Other | Attending: Internal Medicine | Admitting: Internal Medicine

## 2023-09-15 ENCOUNTER — Encounter: Payer: Self-pay | Admitting: Emergency Medicine

## 2023-09-15 DIAGNOSIS — J029 Acute pharyngitis, unspecified: Secondary | ICD-10-CM | POA: Diagnosis not present

## 2023-09-15 DIAGNOSIS — Z20818 Contact with and (suspected) exposure to other bacterial communicable diseases: Secondary | ICD-10-CM | POA: Insufficient documentation

## 2023-09-15 LAB — POCT RAPID STREP A (OFFICE): Rapid Strep A Screen: NEGATIVE

## 2023-09-15 LAB — POCT MONO SCREEN (KUC): Mono, POC: NEGATIVE

## 2023-09-15 MED ORDER — AMOXICILLIN-POT CLAVULANATE 875-125 MG PO TABS: 1.0000 | ORAL_TABLET | Freq: Two times a day (BID) | ORAL | 0 refills | Status: AC

## 2023-09-15 NOTE — ED Triage Notes (Signed)
Patient c/o sore throat x 2 days, low grade fever of 99.6.  Patient has taken Tylenol.  Feels like strep throat.

## 2023-09-15 NOTE — Discharge Instructions (Signed)
Rapid strep and rapid mono were negative.  Throat culture is pending.  Will call if it is positive.  I have prescribed an antibiotic for symptoms.  Follow-up if any symptoms persist or worsen.

## 2023-09-15 NOTE — ED Provider Notes (Signed)
[facility]    CSN: [id number] Arrival date & time: 09/15/23  1408      History   Chief Complaint Chief Complaint  Patient presents with   Sore Throat    HPI Vickie Landry is a 22 y.o. female.   Patient presents with sore throat that started 2 days ago.  She reports that she also has nasal congestion but thinks this is baseline given that she has a cat that sleeps in her bed.  Reports Tmax at home of 99.  Reports that her girlfriend was recently exposed to strep throat.  Has taken Tylenol for symptoms.  Denies chest pain or shortness of breath.   Sore Throat    Past Medical History:  Diagnosis Date   Collar bone fracture     Patient Active Problem List   Diagnosis Date Noted   Appendicitis, acute 11/18/2014   Acute appendicitis 11/17/2014    Past Surgical History:  Procedure Laterality Date   APPENDECTOMY     LAPAROSCOPIC APPENDECTOMY N/A 11/18/2014   Procedure: APPENDECTOMY LAPAROSCOPIC;  Surgeon: Judie Petit. Leonia Corona, MD;  Location: [facility] OR;  Service: Pediatrics;  Laterality: N/A;    OB History   No obstetric history on file.      Home Medications    Prior to Admission medications   Medication Sig Start Date End Date Taking? Authorizing Provider  amoxicillin-clavulanate (AUGMENTIN) 875-125 MG tablet Take 1 tablet by mouth every 12 (twelve) hours for 10 days. 09/15/23 09/25/23 Yes Nhung Danko, Acie Fredrickson, FNP  acetaminophen (TYLENOL) 500 MG tablet Take 1,000 mg by mouth every 6 (six) hours as needed.    [provider]  famotidine (PEPCID) 20 MG tablet Take 1 tablet (20 mg total) by mouth 2 (two) times daily. 07/31/20   Cathie Hoops, Amy V, PA-C  ibuprofen (ADVIL) 800 MG tablet Take 1 tablet (800 mg total) by mouth 3 (three) times daily. 04/14/21   Wieters, Hallie C, PA-C  ondansetron (ZOFRAN ODT) 4 MG disintegrating tablet Take 1 tablet (4 mg total) by mouth every 8 (eight) hours as needed for nausea or vomiting. 07/31/20   Cathie Hoops, Amy V, PA-C  fexofenadine  (ALLEGRA) 60 MG tablet Take 1 tablet (60 mg total) by mouth daily. Patient not taking: Reported on 08/05/2019 07/28/17 10/11/19  Maczis, Elmer Sow, PA-C  fluticasone [facility]) 50 MCG/ACT nasal spray Place 2 sprays into both nostrils daily. Patient not taking: Reported on 08/05/2019 07/28/17 10/11/19  Maczis, Elmer Sow, PA-C    Family History Family History  Problem Relation Age of Onset   Healthy Mother    Healthy Father     Social History Social History   Tobacco Use   Smoking status: Never   Smokeless tobacco: Never  Vaping Use   Vaping status: Every Day   Substances: Nicotine  Substance Use Topics   Alcohol use: Yes   Drug use: Not Currently     Allergies   Other   Review of Systems Review of Systems Per HPI  Physical Exam Triage Vital Signs ED Triage Vitals  Encounter Vitals Group     BP 09/15/23 1559 103/73     Systolic BP Percentile --      Diastolic BP Percentile --      Pulse Rate 09/15/23 1559 92     Resp 09/15/23 1559 16     Temp 09/15/23 1559 99.4 F (37.4 C)     Temp Source 09/15/23 1559 Oral     SpO2 09/15/23 1559 97 %  Weight 09/15/23 1600 140 lb (63.5 kg)     Height 09/15/23 1600 5\' 6"  (1.676 m)     Head Circumference --      Peak Flow --      Pain Score 09/15/23 1600 3     Pain Loc --      Pain Education --      Exclude from Growth Chart --    No data found.  Updated Vital Signs BP 103/73 (BP Location: Left Arm)   Pulse 92   Temp 99.4 F (37.4 C) (Oral)   Resp 16   Ht 5\' 6"  (1.676 m)   Wt 140 lb (63.5 kg)   LMP 09/13/2023 (Exact Date)   SpO2 97%   BMI 22.60 kg/m   Visual Acuity Right Eye Distance:   Left Eye Distance:   Bilateral Distance:    Right Eye Near:   Left Eye Near:    Bilateral Near:     Physical Exam Constitutional:      General: She is not in acute distress.    Appearance: Normal appearance. She is not toxic-appearing or diaphoretic.  HENT:     Head: Normocephalic and atraumatic.     Right Ear: Tympanic  membrane and ear canal normal.     Left Ear: Tympanic membrane and ear canal normal.     Nose: Congestion present.     Mouth/Throat:     Mouth: Mucous membranes are moist.     Pharynx: Posterior oropharyngeal erythema present. No oropharyngeal exudate.     Tonsils: Tonsillar exudate present. No tonsillar abscesses. 1+ on the right. 1+ on the left.  Eyes:     Extraocular Movements: Extraocular movements intact.     Conjunctiva/sclera: Conjunctivae normal.     Pupils: Pupils are equal, round, and reactive to light.  Cardiovascular:     Rate and Rhythm: Normal rate and regular rhythm.     Pulses: Normal pulses.     Heart sounds: Normal heart sounds.  Pulmonary:     Effort: Pulmonary effort is normal. No respiratory distress.     Breath sounds: Normal breath sounds. No wheezing.  Abdominal:     General: Abdomen is flat. Bowel sounds are normal.     Palpations: Abdomen is soft.  Musculoskeletal:        General: Normal range of motion.     Cervical back: Normal range of motion.  Skin:    General: Skin is warm and dry.  Neurological:     General: No focal deficit present.     Mental Status: She is alert and oriented to person, place, and time. Mental status is at baseline.  Psychiatric:        Mood and Affect: Mood normal.        Behavior: Behavior normal.      UC Treatments / Results  Labs (all labs ordered are listed, but only abnormal results are displayed) Labs Reviewed  CULTURE, GROUP A STREP [facility])  POCT RAPID STREP A (OFFICE)  POCT MONO SCREEN ([facility])    EKG   Radiology No results found.  Procedures Procedures (including critical care time)  Medications Ordered in UC Medications - No data to display  Initial Impression / Assessment and Plan / UC Course  I have reviewed the triage vital signs and the nursing notes.  Pertinent labs & imaging results that were available during my care of the patient were reviewed by me and considered in my medical decision  making (see chart for details).  Rapid strep and rapid mono negative.  Given appearance of posterior pharynx on exam, will opt to treat with Augmentin antibiotic today.  Patient reports that she was on an antibiotic approximately 1 month ago for a different reason, so Augmentin was chosen today for treatment.  Will send throat culture to confirm.  Discussed with patient that if throat culture is negative, this could be viral etiology and symptom management is recommended.  Advised strict follow-up precautions.  No signs of peritonsillar abscess on exam.  Patient verbalized understanding and was agreeable with plan. Final Clinical Impressions(s) / UC Diagnoses   Final diagnoses:  Acute pharyngitis, unspecified etiology  Exposure to strep throat     Discharge Instructions      Rapid strep and rapid mono were negative.  Throat culture is pending.  Will call if it is positive.  I have prescribed an antibiotic for symptoms.  Follow-up if any symptoms persist or worsen.     ED Prescriptions     Medication Sig Dispense Auth. Provider   amoxicillin-clavulanate (AUGMENTIN) 875-125 MG tablet Take 1 tablet by mouth every 12 (twelve) hours for 10 days. 20 tablet Croom, Acie Fredrickson, [place]      PDMP not reviewed this encounter.   Gustavus Bryant, [place] 09/15/23 [phone]

## 2023-09-17 LAB — CULTURE, GROUP A STREP (THRC)
